# Patient Record
Sex: Male | Born: 1985 | Race: White | Hispanic: No | Marital: Single | Smoking: Current every day smoker
Health system: Southern US, Community
[De-identification: ages and names within clinical notes are randomized; demographics above are authoritative.]

## PROBLEM LIST (undated history)

## (undated) DIAGNOSIS — I10 Essential (primary) hypertension: Secondary | ICD-10-CM

---

## 2005-10-25 ENCOUNTER — Emergency Department: Payer: Self-pay | Admitting: Internal Medicine

## 2005-11-01 ENCOUNTER — Emergency Department: Payer: Self-pay | Admitting: Emergency Medicine

## 2006-06-24 ENCOUNTER — Emergency Department: Payer: Self-pay | Admitting: Unknown Physician Specialty

## 2017-03-20 ENCOUNTER — Ambulatory Visit: Payer: Self-pay

## 2017-03-20 NOTE — Telephone Encounter (Signed)
Pt. called to office to schedule an appt. To get re-established with provider at [facility].  He stated he was waking up at night with acid reflux and chest discomfort.  When call was transferred, the call go disconnected; unsure if pt. hung up.  Attempted to call pt. back, but unable to reach pt. Or leave message; "voice mail full".

## 2017-03-22 NOTE — Telephone Encounter (Signed)
Several attempts made to reach pt. By phone; left message to call office to speak to Triage nurse re: c/o acid reflux.

## 2017-04-19 ENCOUNTER — Ambulatory Visit: Payer: Self-pay | Admitting: Family Medicine

## 2017-06-28 ENCOUNTER — Encounter

## 2017-06-28 ENCOUNTER — Ambulatory Visit: Payer: Self-pay | Admitting: Family Medicine

## 2019-08-21 ENCOUNTER — Emergency Department: Payer: Self-pay

## 2019-08-21 ENCOUNTER — Emergency Department
Admission: EM | Admit: 2019-08-21 | Discharge: 2019-08-21 | Disposition: A | Discharge status: Home / Self Care | Payer: Self-pay | Attending: Emergency Medicine | Admitting: Emergency Medicine

## 2019-08-21 ENCOUNTER — Other Ambulatory Visit: Payer: Self-pay

## 2019-08-21 DIAGNOSIS — Y9389 Activity, other specified: Secondary | ICD-10-CM | POA: Insufficient documentation

## 2019-08-21 DIAGNOSIS — W11XXXA Fall on and from ladder, initial encounter: Secondary | ICD-10-CM | POA: Insufficient documentation

## 2019-08-21 DIAGNOSIS — Y999 Unspecified external cause status: Secondary | ICD-10-CM | POA: Insufficient documentation

## 2019-08-21 DIAGNOSIS — Y9289 Other specified places as the place of occurrence of the external cause: Secondary | ICD-10-CM | POA: Insufficient documentation

## 2019-08-21 DIAGNOSIS — S92062A Displaced intraarticular fracture of left calcaneus, initial encounter for closed fracture: Secondary | ICD-10-CM | POA: Insufficient documentation

## 2019-08-21 DIAGNOSIS — S82892A Other fracture of left lower leg, initial encounter for closed fracture: Secondary | ICD-10-CM

## 2019-08-21 MED ORDER — IBUPROFEN 600 MG PO TABS: 600.0000 mg | ORAL_TABLET | Freq: Three times a day (TID) | ORAL | 0 refills | Status: AC | PRN

## 2019-08-21 MED ORDER — OXYCODONE-ACETAMINOPHEN 7.5-325 MG PO TABS: 1.0000 | ORAL_TABLET | Freq: Four times a day (QID) | ORAL | 0 refills | Status: DC | PRN

## 2019-08-21 MED ORDER — OXYCODONE-ACETAMINOPHEN 5-325 MG PO TABS
1.0000 | ORAL_TABLET | ORAL | Status: DC | PRN
  Administered 2019-08-21: 1 via ORAL
  Filled 2019-08-21: qty 1

## 2019-08-21 NOTE — Discharge Instructions (Signed)
Wear splint and ambulate with crutches pending further evaluation by orthopedics.  Call Monday morning and tell them you are a follow-up from the emergency room.  Be advised medication will cause drowsiness.

## 2019-08-21 NOTE — ED Triage Notes (Signed)
Pt arrives via POV after falling off a ladder around 11:30 this morning. Pt reports he stepped out of a window onto the ladder and the ladder slipped out from under him and he fell to the ground and felt a pop when his left foot hit the ground. Pt denies hitting head , no LOC. Pt in NAD, skin warm and dry

## 2019-08-21 NOTE — ED Provider Notes (Signed)
[facility] Emergency Department Provider Note   ____________________________________________   First MD Initiated Contact with Patient 08/21/19 1359     (approximate)  I have reviewed the triage vital signs and the nursing notes.   HISTORY  Chief Complaint Ankle Injury    HPI Jason Hines is a 34 y.o. male patient complain of left foot pain secondary to a fall.  Patient state he was stepping out of a window onto a ladder and the ladder slipped from under him and he fell landing on the ground.  Patient state he felt a "pop" in his left foot when at the ground.  Patient denies LOC or head injury.  Patient denies neck or back pain.  Patient unable to bear weight secondary to pain.  Patient rates pain as a 10/10.  Patient described the pain as "achy".  No palliative measure prior to arrival.         History reviewed. No pertinent past medical history.  There are no problems to display for this patient.     Prior to Admission medications   Medication Sig Start Date End Date Taking? Authorizing Provider  ibuprofen (ADVIL) 600 MG tablet Take 1 tablet (600 mg total) by mouth every 8 (eight) hours as needed. 08/21/19   Joni Reining, PA-C  oxyCODONE-acetaminophen (PERCOCET) 7.5-325 MG tablet Take 1 tablet by mouth every 6 (six) hours as needed. 08/21/19   Joni Reining, PA-C    Allergies Patient has no known allergies.  History reviewed. No pertinent family history.  Social History Social History   Tobacco Use   Smoking status: Not on file  Substance Use Topics   Alcohol use: Not on file   Drug use: Not on file    Review of Systems Constitutional: No fever/chills Eyes: No visual changes. ENT: No sore throat. Cardiovascular: Denies chest pain. Respiratory: Denies shortness of breath. Gastrointestinal: No abdominal pain.  No nausea, no vomiting.  No diarrhea.  No constipation. Genitourinary: Negative for dysuria. Musculoskeletal: Left  foot pain. Skin: Negative for rash. Neurological: Negative for headaches, focal weakness or numbness.   ____________________________________________   PHYSICAL EXAM:  VITAL SIGNS: ED Triage Vitals [08/21/19 1231]  Enc Vitals Group     BP (!) 145/105     Pulse Rate 65     Resp 18     Temp 98.3 F (36.8 C)     Temp Source Oral     SpO2 98 %     Weight (!) 210 lb (95.3 kg)     Height 5\' 9"  (1.753 m)     Head Circumference      Peak Flow      Pain Score 10     Pain Loc      Pain Edu?      Excl. in GC?    Constitutional: Alert and oriented. Well appearing and in no acute distress. Cardiovascular: Normal rate, regular rhythm. Grossly normal heart sounds.  Good peripheral circulation. Respiratory: Normal respiratory effort.  No retractions. Lungs CTAB. Gastrointestinal: Soft and nontender. No Musculoskeletal: No obvious deformity to the left foot.  Patient has moderate guarding palpation of the talar joint.  Patient decreased range of motion with flexion extension of foot limited by complaint of pain. Neurologic:  Normal speech and language. No gross focal neurologic deficits are appreciated. No gait instability. Skin:  Skin is warm, dry and intact. No rash noted. Psychiatric: Mood and affect are normal. Speech and behavior are normal.  ____________________________________________  LABS (all labs ordered are listed, but only abnormal results are displayed)  Labs Reviewed - No data to display ____________________________________________  EKG   ____________________________________________  RADIOLOGY  ED MD interpretation:    Official radiology report(s): DG Ankle Complete Left  Result Date: 08/21/2019 CLINICAL DATA:  Left ankle pain after fall EXAM: LEFT ANKLE COMPLETE - 3+ VIEW COMPARISON:  None. FINDINGS: Comminuted fractures of the mid calcaneal body with intra-articular extension to the posterior subtalar joint. Subtalar joint space appears widened. There is a  fracture fragment at the lateral aspect of the calcaneus measuring at least 1.7 cm with mild lateral displacement. Suspect small nondisplaced fracture from the lateral aspect of the cuboid. Ankle mortise is congruent without dislocation. There is soft tissue swelling at the lateral hindfoot. IMPRESSION: 1. Comminuted fractures of the mid calcaneal body with intra-articular extension to the posterior subtalar joint. Subtalar joint appears slightly widened. Further evaluation could be performed with CT as clinically indicated. 2. Suspect small nondisplaced fracture from the lateral aspect of the cuboid. Electronically Signed   By: Duanne Guess D.O.   On: 08/21/2019 13:32    ____________________________________________   PROCEDURES  Procedure(s) performed (including Critical Care):  Procedures   ____________________________________________   INITIAL IMPRESSION / ASSESSMENT AND PLAN / ED COURSE  As part of my medical decision making, I reviewed the following data within the electronic MEDICAL RECORD NUMBER     Patient presents with left foot pain secondary to fall.  Discussed x-ray findings with patient.  Patient placed in a splint and given crutches to assist with ambulation.  Patient will follow up with podiatry by calling for an appointment.  Patient advised to drug effects of medication.  Patient given a work note.    [name] was evaluated in Emergency Department on 08/21/2019 for the symptoms described in the history of present illness. He was evaluated in the context of the global COVID-19 pandemic, which necessitated consideration that the patient might be at risk for infection with the SARS-CoV-2 virus that causes COVID-19. Institutional protocols and algorithms that pertain to the evaluation of patients at risk for COVID-19 are in a state of rapid change based on information released by regulatory bodies including the CDC and federal and state organizations. These policies and  algorithms were followed during the patient's care in the ED.       ____________________________________________   FINAL CLINICAL IMPRESSION(S) / ED DIAGNOSES  Final diagnoses:  Closed fracture of left ankle, initial encounter     ED Discharge Orders         Ordered    oxyCODONE-acetaminophen (PERCOCET) 7.5-325 MG tablet  Every 6 hours PRN     Discontinue  Reprint     08/21/19 1421    ibuprofen (ADVIL) 600 MG tablet  Every 8 hours PRN     Discontinue  Reprint     08/21/19 1421           Note:  This document was prepared using Dragon voice recognition software and may include unintentional dictation errors.    Joni Reining, PA-C 08/21/19 1427    Sharyn Creamer, MD 08/21/19 [phone]

## 2019-08-21 NOTE — ED Notes (Signed)
See triage note  Presents with injury to left ankle  States he fell from ladder,approx 3 ft  Rolled his ankle  unable to bear full wt  Good pulses

## 2019-08-24 ENCOUNTER — Other Ambulatory Visit: Payer: Self-pay | Admitting: Podiatry

## 2019-08-24 DIAGNOSIS — S92012D Displaced fracture of body of left calcaneus, subsequent encounter for fracture with routine healing: Secondary | ICD-10-CM

## 2019-08-25 ENCOUNTER — Ambulatory Visit
Admission: RE | Admit: 2019-08-25 | Discharge: 2019-08-25 | Disposition: A | Discharge status: Home / Self Care | Payer: Self-pay | Source: Ambulatory Visit | Attending: Podiatry | Admitting: Podiatry

## 2019-08-25 ENCOUNTER — Other Ambulatory Visit: Payer: Self-pay

## 2019-08-25 DIAGNOSIS — S92012D Displaced fracture of body of left calcaneus, subsequent encounter for fracture with routine healing: Secondary | ICD-10-CM | POA: Insufficient documentation

## 2019-08-31 ENCOUNTER — Other Ambulatory Visit: Payer: Self-pay | Admitting: Podiatry

## 2019-08-31 ENCOUNTER — Encounter
Admission: RE | Admit: 2019-08-31 | Discharge: 2019-08-31 | Disposition: A | Discharge status: Home / Self Care | Payer: Self-pay | Source: Ambulatory Visit | Attending: Podiatry | Admitting: Podiatry

## 2019-08-31 ENCOUNTER — Other Ambulatory Visit: Payer: Self-pay

## 2019-08-31 NOTE — Patient Instructions (Signed)
Your procedure is scheduled on: 09/04/19  Report to DAY SURGERY DEPARTMENT LOCATED ON 2ND FLOOR MEDICAL MALL ENTRANCE. To find out your arrival time please call [phone] between 1PM - 3PM on 09/03/19.  Remember: Instructions that are not followed completely may result in serious medical risk, up to and including death, or upon the discretion of your surgeon and anesthesiologist your surgery may need to be rescheduled.     _X__ 1. Do not eat food after midnight the night before your procedure.                 No gum chewing or hard candies. You may drink clear liquids up to 2 hours                 before you are scheduled to arrive for your surgery- DO not drink clear                 liquids within 2 hours of the start of your surgery.                 Clear Liquids include:  water, apple juice without pulp, clear carbohydrate                 drink such as Clearfast or Gatorade, Black Coffee or Tea (Do not add                 anything to coffee or tea). Diabetics water only  __X__2.  On the morning of surgery brush your teeth with toothpaste and water, you                 may rinse your mouth with mouthwash if you wish.  Do not swallow any              toothpaste of mouthwash.     _X__ 3.  No Alcohol for 24 hours before or after surgery.   _X__ 4.  Do Not Smoke or use e-cigarettes For 24 Hours Prior to Your Surgery.                 Do not use any chewable tobacco products for at least 6 hours prior to                 surgery.  ____  5.  Bring all medications with you on the day of surgery if instructed.   __X__  6.  Notify your doctor if there is any change in your medical condition      (cold, fever, infections).     Do not wear jewelry, make-up, hairpins, clips or nail polish. Do not wear lotions, powders, or perfumes.  Do not shave 48 hours prior to surgery. Men may shave face and neck. Do not bring valuables to the hospital.    [facility] is not responsible for any belongings  or valuables.  Contacts, dentures/partials or body piercings may not be worn into surgery. Bring a case for your contacts, glasses or hearing aids, a denture cup will be supplied. Leave your suitcase in the car. After surgery it may be brought to your room. For patients admitted to the hospital, discharge time is determined by your treatment team.   Patients discharged the day of surgery will not be allowed to drive home.   Please read over the following fact sheets that you were given:   MRSA Information  __X__ Take these medicines the morning of surgery with A SIP OF WATER:  1. oxyCODONE-acetaminophen (PERCOCET) 7.5-325 MG tablet if needed  2.   3.   4.  5.  6.  ____ Fleet Enema (as directed)   __X__ Use CHG Soap/SAGE wipes as directed  ____ Use inhalers on the day of surgery  ____ Stop metformin/Janumet/Farxiga 2 days prior to surgery    ____ Take 1/2 of usual insulin dose the night before surgery. No insulin the morning          of surgery.   ____ Stop Blood Thinners Coumadin/Plavix/Xarelto/Pleta/Pradaxa/Eliquis/Effient/Aspirin  on   Or contact your Surgeon, Cardiologist or Medical Doctor regarding  ability to stop your blood thinners  __X__ Stop Anti-inflammatories 7 days before surgery such as Advil, Ibuprofen, Motrin,  BC or Goodies Powder, Naprosyn, Naproxen, Aleve, Aspirin    __X__ Stop all herbal supplements, fish oil or vitamin E until after surgery.    ____ Bring C-Pap to the hospital.    ENSURE PRE SURGERY DRINK TO BE FINISHED 2 HOURS PRIOR TO ARRIVAL    How to Use an Incentive Spirometer An incentive spirometer is a tool that measures how well you are filling your lungs with each breath. Learning to take long, deep breaths using this tool can help you keep your lungs clear and active. This may help to reverse or lessen your chance of developing breathing (pulmonary) problems, especially infection. You may be asked to use a spirometer:  After a  surgery.  If you have a lung problem or a history of smoking.  After a long period of time when you have been unable to move or be active. If the spirometer includes an indicator to show the highest number that you have reached, your health care provider or respiratory therapist will help you set a goal. Keep a list (log) of your progress as told by your health care provider. What are the risks?  Breathing too quickly may cause dizziness or cause you to pass out. Take your time so you do not get dizzy or light-headed.  If you are in pain, you may need to take pain medicine before doing incentive spirometry. It is harder to take a deep breath if you are having pain. How to use your incentive spirometer  1. Sit up on the edge of your bed or on a chair. 2. Hold the incentive spirometer so that it is in an upright position. 3. Before you use the spirometer, breathe out normally. 4. Place the mouthpiece in your mouth. Make sure your lips are closed tightly around it. 5. Breathe in slowly and as deeply as you can through your mouth, causing the piston or the ball to rise toward the top of the chamber. 6. Hold your breath for 3-5 seconds, or for as long as possible. ? If the spirometer includes a coach indicator, use this to guide you in breathing. Slow down your breathing if the indicator goes above the marked areas. 7. Remove the mouthpiece from your mouth and breathe out normally. The piston or ball will return to the bottom of the chamber. 8. Rest for a few seconds, then repeat the steps 10 or more times. ? Take your time and take a few normal breaths between deep breaths so that you do not get dizzy or light-headed. ? Do this every 1-2 hours when you are awake. 9. If the spirometer includes a goal marker to show the highest number you have reached (best effort), use this as a goal to work toward during each repetition. 10. After each  set of 10 deep breaths, cough a few times. This will help to  make sure that your lungs are clear. ? If you have an incision on your chest or abdomen from surgery, place a pillow or a rolled-up towel firmly against the incision when you cough. This can help to reduce pain from coughing. General tips  When you become able to get out of bed, walk around often and continue to cough to help clear your lungs.  Keep using the incentive spirometer until your health care provider says it is okay to stop using it. If you have been in the hospital, you may be told to keep using the spirometer at home. Contact a health care provider if:  You are having difficulty using the spirometer.  You have trouble using the spirometer as often as instructed.  Your pain medicine is not giving enough relief for you to use the spirometer as told.  You have a fever.  You develop shortness of breath. Get help right away if:  You develop a cough with bloody mucus from the lungs (bloody sputum).  You have fluid or blood coming from an incision site after you cough. Summary  An incentive spirometer is a tool that can help you learn to take long, deep breaths to keep your lungs clear and active.  You may be asked to use a spirometer after a surgery, if you have a lung problem or a history of smoking, or if you have been inactive for a long period of time.  Use your incentive spirometer as instructed every 1-2 hours while you are awake.  If you have an incision on your chest or abdomen, place a pillow or a rolled-up towel firmly against your incision when you cough. This will help to reduce pain. This information is not intended to replace advice given to you by your health care provider. Make sure you discuss any questions you have with your health care provider. Document Revised: 08/08/2018 Document Reviewed: 11/21/2016 Elsevier Patient Education  2020 [employer].

## 2019-09-02 ENCOUNTER — Other Ambulatory Visit
Admission: RE | Admit: 2019-09-02 | Discharge: 2019-09-02 | Disposition: A | Discharge status: Home / Self Care | Payer: Self-pay | Source: Ambulatory Visit | Attending: Family Medicine | Admitting: Family Medicine

## 2019-09-02 ENCOUNTER — Other Ambulatory Visit: Payer: Self-pay

## 2019-09-02 DIAGNOSIS — Z01812 Encounter for preprocedural laboratory examination: Secondary | ICD-10-CM | POA: Insufficient documentation

## 2019-09-02 DIAGNOSIS — Z20822 Contact with and (suspected) exposure to covid-19: Secondary | ICD-10-CM | POA: Insufficient documentation

## 2019-09-02 LAB — SARS CORONAVIRUS 2 (TAT 6-24 HRS): SARS Coronavirus 2: NEGATIVE

## 2019-09-04 ENCOUNTER — Other Ambulatory Visit: Payer: Self-pay

## 2019-09-04 ENCOUNTER — Ambulatory Visit: Payer: Self-pay

## 2019-09-04 ENCOUNTER — Ambulatory Visit
Admission: RE | Admit: 2019-09-04 | Discharge: 2019-09-04 | Disposition: A | Discharge status: Home / Self Care | Payer: Self-pay | Attending: Podiatry | Admitting: Podiatry

## 2019-09-04 ENCOUNTER — Ambulatory Visit: Payer: Self-pay | Admitting: Anesthesiology

## 2019-09-04 ENCOUNTER — Encounter: Payer: Self-pay | Admitting: Podiatry

## 2019-09-04 ENCOUNTER — Encounter
Admission: RE | Disposition: A | Discharge status: Home / Self Care | Payer: Self-pay | Source: Home / Self Care | Attending: Podiatry

## 2019-09-04 DIAGNOSIS — M25572 Pain in left ankle and joints of left foot: Secondary | ICD-10-CM

## 2019-09-04 DIAGNOSIS — T148XXA Other injury of unspecified body region, initial encounter: Secondary | ICD-10-CM

## 2019-09-04 DIAGNOSIS — W1789XA Other fall from one level to another, initial encounter: Secondary | ICD-10-CM | POA: Insufficient documentation

## 2019-09-04 DIAGNOSIS — S92062A Displaced intraarticular fracture of left calcaneus, initial encounter for closed fracture: Secondary | ICD-10-CM | POA: Insufficient documentation

## 2019-09-04 DIAGNOSIS — R262 Difficulty in walking, not elsewhere classified: Secondary | ICD-10-CM | POA: Insufficient documentation

## 2019-09-04 DIAGNOSIS — F17210 Nicotine dependence, cigarettes, uncomplicated: Secondary | ICD-10-CM | POA: Insufficient documentation

## 2019-09-04 HISTORY — PX: ORIF CALCANEOUS FRACTURE: SHX5030

## 2019-09-04 SURGERY — OPEN REDUCTION INTERNAL FIXATION (ORIF) CALCANEOUS FRACTURE
Anesthesia: General | Site: Foot | Laterality: Left

## 2019-09-04 MED ORDER — HYDRALAZINE HCL 20 MG/ML IJ SOLN
INTRAMUSCULAR | Status: AC
  Filled 2019-09-04: qty 1

## 2019-09-04 MED ORDER — SUGAMMADEX SODIUM 200 MG/2ML IV SOLN
INTRAVENOUS | Status: DC | PRN
  Administered 2019-09-04: 300 mg via INTRAVENOUS

## 2019-09-04 MED ORDER — FENTANYL CITRATE (PF) 100 MCG/2ML IJ SOLN
25.0000 ug | INTRAMUSCULAR | Status: AC | PRN
  Administered 2019-09-04: 50 ug via INTRAVENOUS
  Administered 2019-09-04 (×3): 25 ug via INTRAVENOUS

## 2019-09-04 MED ORDER — MIDAZOLAM HCL 2 MG/2ML IJ SOLN
INTRAMUSCULAR | Status: AC
  Administered 2019-09-04: 1 mg via INTRAVENOUS
  Filled 2019-09-04: qty 2

## 2019-09-04 MED ORDER — FAMOTIDINE 20 MG PO TABS
ORAL_TABLET | ORAL | Status: AC
  Administered 2019-09-04: 20 mg via ORAL
  Filled 2019-09-04: qty 1

## 2019-09-04 MED ORDER — BUPIVACAINE HCL (PF) 0.5 % IJ SOLN
INTRAMUSCULAR | Status: AC
  Filled 2019-09-04: qty 30

## 2019-09-04 MED ORDER — LIDOCAINE HCL (CARDIAC) PF 100 MG/5ML IV SOSY
PREFILLED_SYRINGE | INTRAVENOUS | Status: DC | PRN
  Administered 2019-09-04: 100 mg via INTRAVENOUS

## 2019-09-04 MED ORDER — FENTANYL CITRATE (PF) 100 MCG/2ML IJ SOLN
INTRAMUSCULAR | Status: AC
  Administered 2019-09-04: 50 ug via INTRAVENOUS
  Filled 2019-09-04: qty 2

## 2019-09-04 MED ORDER — OXYCODONE HCL 5 MG PO TABS
ORAL_TABLET | ORAL | Status: AC
  Filled 2019-09-04: qty 1

## 2019-09-04 MED ORDER — FENTANYL CITRATE (PF) 100 MCG/2ML IJ SOLN: 50.0000 ug | Freq: Once | INTRAMUSCULAR | Status: AC

## 2019-09-04 MED ORDER — CEFAZOLIN SODIUM-DEXTROSE 2-4 GM/100ML-% IV SOLN
2.0000 g | INTRAVENOUS | Status: AC
  Administered 2019-09-04: 2 g via INTRAVENOUS

## 2019-09-04 MED ORDER — CEFAZOLIN SODIUM-DEXTROSE 2-4 GM/100ML-% IV SOLN
INTRAVENOUS | Status: AC
  Filled 2019-09-04: qty 100

## 2019-09-04 MED ORDER — CHLORHEXIDINE GLUCONATE 0.12 % MT SOLN
OROMUCOSAL | Status: AC
  Administered 2019-09-04: 15 mL via OROMUCOSAL
  Filled 2019-09-04: qty 15

## 2019-09-04 MED ORDER — POVIDONE-IODINE 7.5 % EX SOLN
Freq: Once | CUTANEOUS | Status: DC
  Filled 2019-09-04: qty 118

## 2019-09-04 MED ORDER — SEVOFLURANE IN SOLN
RESPIRATORY_TRACT | Status: AC
  Filled 2019-09-04: qty 250

## 2019-09-04 MED ORDER — PROPOFOL 10 MG/ML IV BOLUS
INTRAVENOUS | Status: DC | PRN
  Administered 2019-09-04: 200 mg via INTRAVENOUS

## 2019-09-04 MED ORDER — MIDAZOLAM HCL 2 MG/2ML IJ SOLN: 1.0000 mg | Freq: Once | INTRAMUSCULAR | Status: AC

## 2019-09-04 MED ORDER — OXYCODONE-ACETAMINOPHEN 7.5-325 MG PO TABS: 1.0000 | ORAL_TABLET | ORAL | 0 refills | Status: AC | PRN

## 2019-09-04 MED ORDER — ONDANSETRON HCL 4 MG/2ML IJ SOLN
INTRAMUSCULAR | Status: DC | PRN
  Administered 2019-09-04: 4 mg via INTRAVENOUS

## 2019-09-04 MED ORDER — PROPOFOL 10 MG/ML IV BOLUS
INTRAVENOUS | Status: AC
  Filled 2019-09-04: qty 20

## 2019-09-04 MED ORDER — APIXABAN 2.5 MG PO TABS: 2.5000 mg | ORAL_TABLET | Freq: Two times a day (BID) | ORAL | 1 refills | Status: AC

## 2019-09-04 MED ORDER — FENTANYL CITRATE (PF) 100 MCG/2ML IJ SOLN
INTRAMUSCULAR | Status: AC
  Filled 2019-09-04: qty 2

## 2019-09-04 MED ORDER — LACTATED RINGERS IV SOLN: INTRAVENOUS | Status: DC

## 2019-09-04 MED ORDER — ONDANSETRON HCL 4 MG PO TABS: 4.0000 mg | ORAL_TABLET | Freq: Three times a day (TID) | ORAL | 0 refills | Status: AC | PRN

## 2019-09-04 MED ORDER — ASPIRIN EC 81 MG PO TBEC: 81.0000 mg | DELAYED_RELEASE_TABLET | Freq: Every day | ORAL | 11 refills | Status: AC

## 2019-09-04 MED ORDER — ROCURONIUM BROMIDE 10 MG/ML (PF) SYRINGE
PREFILLED_SYRINGE | INTRAVENOUS | Status: AC
  Filled 2019-09-04: qty 10

## 2019-09-04 MED ORDER — CEPHALEXIN 500 MG PO CAPS: 500.0000 mg | ORAL_CAPSULE | Freq: Three times a day (TID) | ORAL | 0 refills | Status: AC

## 2019-09-04 MED ORDER — CHLORHEXIDINE GLUCONATE 0.12 % MT SOLN: 15.0000 mL | Freq: Once | OROMUCOSAL | Status: AC

## 2019-09-04 MED ORDER — LIDOCAINE HCL (PF) 1 % IJ SOLN
INTRAMUSCULAR | Status: DC | PRN
  Administered 2019-09-04 (×2): 1 mL via SUBCUTANEOUS

## 2019-09-04 MED ORDER — OXYCODONE HCL 5 MG/5ML PO SOLN: 5.0000 mg | Freq: Once | ORAL | Status: AC | PRN

## 2019-09-04 MED ORDER — FAMOTIDINE 20 MG PO TABS: 20.0000 mg | ORAL_TABLET | Freq: Once | ORAL | Status: AC

## 2019-09-04 MED ORDER — FENTANYL CITRATE (PF) 100 MCG/2ML IJ SOLN
INTRAMUSCULAR | Status: AC
  Administered 2019-09-04: 25 ug via INTRAVENOUS
  Filled 2019-09-04: qty 2

## 2019-09-04 MED ORDER — MIDAZOLAM HCL 2 MG/2ML IJ SOLN
INTRAMUSCULAR | Status: AC
  Filled 2019-09-04: qty 2

## 2019-09-04 MED ORDER — OXYCODONE HCL 5 MG PO TABS
5.0000 mg | ORAL_TABLET | Freq: Once | ORAL | Status: AC | PRN
  Administered 2019-09-04: 5 mg via ORAL

## 2019-09-04 MED ORDER — FENTANYL CITRATE (PF) 250 MCG/5ML IJ SOLN
INTRAMUSCULAR | Status: AC
  Filled 2019-09-04: qty 5

## 2019-09-04 MED ORDER — HYDRALAZINE HCL 20 MG/ML IJ SOLN
10.0000 mg | Freq: Once | INTRAMUSCULAR | Status: AC
  Administered 2019-09-04: 10 mg via INTRAVENOUS
  Filled 2019-09-04: qty 0.5

## 2019-09-04 MED ORDER — LIDOCAINE HCL (PF) 1 % IJ SOLN
INTRAMUSCULAR | Status: AC
  Filled 2019-09-04: qty 5

## 2019-09-04 MED ORDER — ORAL CARE MOUTH RINSE: 15.0000 mL | Freq: Once | OROMUCOSAL | Status: AC

## 2019-09-04 MED ORDER — BUPIVACAINE HCL (PF) 0.5 % IJ SOLN
INTRAMUSCULAR | Status: DC | PRN
  Administered 2019-09-04: 5 mL via PERINEURAL
  Administered 2019-09-04: 10 mL via PERINEURAL
  Administered 2019-09-04: 5 mL via PERINEURAL
  Administered 2019-09-04: 10 mL via PERINEURAL

## 2019-09-04 MED ORDER — ROCURONIUM BROMIDE 100 MG/10ML IV SOLN
INTRAVENOUS | Status: DC | PRN
  Administered 2019-09-04: 100 mg via INTRAVENOUS
  Administered 2019-09-04: 20 mg via INTRAVENOUS

## 2019-09-04 MED ORDER — LIDOCAINE HCL (PF) 2 % IJ SOLN
INTRAMUSCULAR | Status: AC
  Filled 2019-09-04: qty 5

## 2019-09-04 MED ORDER — FENTANYL CITRATE (PF) 100 MCG/2ML IJ SOLN
INTRAMUSCULAR | Status: DC | PRN
  Administered 2019-09-04: 50 ug via INTRAVENOUS
  Administered 2019-09-04 (×2): 100 ug via INTRAVENOUS
  Administered 2019-09-04 (×2): 50 ug via INTRAVENOUS

## 2019-09-04 SURGICAL SUPPLY — 64 items
BIT DRILL 2.4 AO COUPLING CANN (BIT) ×3 IMPLANT
BIT DRILL 2.5X2.75 QC CALB (BIT) ×3 IMPLANT
BIT DRILL CALIBRATED 2.7 (BIT) ×2 IMPLANT
BIT DRILL CALIBRATED 2.7MM (BIT) ×1
BLADE SURG 15 STRL LF DISP TIS (BLADE) ×1 IMPLANT
BLADE SURG 15 STRL SS (BLADE) ×3
BNDG CMPR STD VLCR NS LF 5.8X4 (GAUZE/BANDAGES/DRESSINGS) ×1
BNDG COHESIVE 4X5 TAN STRL (GAUZE/BANDAGES/DRESSINGS) ×3 IMPLANT
BNDG CONFORM 2 STRL LF (GAUZE/BANDAGES/DRESSINGS) ×3 IMPLANT
BNDG CONFORM 3 STRL LF (GAUZE/BANDAGES/DRESSINGS) ×3 IMPLANT
BNDG ELASTIC 4X5.8 VLCR NS LF (GAUZE/BANDAGES/DRESSINGS) ×3 IMPLANT
BNDG ESMARK 4X12 TAN STRL LF (GAUZE/BANDAGES/DRESSINGS) ×3 IMPLANT
BNDG GAUZE 4.5X4.1 6PLY STRL (MISCELLANEOUS) ×3 IMPLANT
CANISTER SUCT 1200ML W/VALVE (MISCELLANEOUS) ×3 IMPLANT
CEMENT CAL PHOSPHATE 5CC (Cement) ×3 IMPLANT
CLOSURE WOUND 1/2 X4 (GAUZE/BANDAGES/DRESSINGS) ×1
COVER WAND RF STERILE (DRAPES) ×3 IMPLANT
CUFF TOURN SGL QUICK 30 (TOURNIQUET CUFF) ×3
CUFF TRNQT CYL 30X4X21-28X (TOURNIQUET CUFF) ×1 IMPLANT
DRAPE C-ARM XRAY 36X54 (DRAPES) ×3 IMPLANT
DRAPE C-ARMOR (DRAPES) ×3 IMPLANT
DURAPREP 26ML APPLICATOR (WOUND CARE) ×6 IMPLANT
ELECT REM PT RETURN 9FT ADLT (ELECTROSURGICAL) ×3
ELECTRODE REM PT RTRN 9FT ADLT (ELECTROSURGICAL) ×1 IMPLANT
GAUZE SPONGE 4X4 12PLY STRL (GAUZE/BANDAGES/DRESSINGS) ×3 IMPLANT
GAUZE XEROFORM 1X8 LF (GAUZE/BANDAGES/DRESSINGS) ×3 IMPLANT
GLOVE BIO SURGEON STRL SZ7 (GLOVE) ×3 IMPLANT
GLOVE INDICATOR 7.0 STRL GRN (GLOVE) ×3 IMPLANT
GOWN STRL REUS W/ TWL LRG LVL3 (GOWN DISPOSABLE) ×3 IMPLANT
GOWN STRL REUS W/TWL LRG LVL3 (GOWN DISPOSABLE) ×9
K-WIRE ACE 1.6X6 (WIRE) ×9
K-WIRE TROC 1.25X150 (WIRE) ×3
KIT TURNOVER KIT A (KITS) ×3 IMPLANT
KWIRE ACE 1.6X6 (WIRE) ×3 IMPLANT
KWIRE TROC 1.25X150 (WIRE) ×1 IMPLANT
LABEL OR SOLS (LABEL) ×3 IMPLANT
NEEDLE HYPO 22GX1.5 SAFETY (NEEDLE) ×3 IMPLANT
NS IRRIG 500ML POUR BTL (IV SOLUTION) ×6 IMPLANT
PACK EXTREMITY (MISCELLANEOUS) ×3 IMPLANT
PAD PREP 24X41 OB/GYN DISP (PERSONAL CARE ITEMS) ×3 IMPLANT
PLATE LOCK CALC LG EXTD 2H LT (Plate) ×3 IMPLANT
SCREW CANNULATED PT 4.0X40 (Screw) ×3 IMPLANT
SCREW CORT 3.5X32 (Screw) ×3 IMPLANT
SCREW CORT T15 32X3.5XST LCK (Screw) ×1 IMPLANT
SCREW LOCK CORT STAR 3.5X26 (Screw) ×3 IMPLANT
SCREW LOCK CORT STAR 3.5X28 (Screw) ×6 IMPLANT
SCREW LOW PROFILE 3.5MMX42 (Screw) ×3 IMPLANT
SCREW NL LP 36X3.5 (Screw) ×3 IMPLANT
SPLINT CAST 1 STEP 4X30 (MISCELLANEOUS) ×3 IMPLANT
SPLINT FAST PLASTER 5X30 (CAST SUPPLIES) ×2
SPLINT PLASTER CAST FAST 5X30 (CAST SUPPLIES) ×1 IMPLANT
SPONGE LAP 18X18 RF (DISPOSABLE) ×3 IMPLANT
STAPLER SKIN PROX 35W (STAPLE) ×3 IMPLANT
STOCKINETTE M/LG 89821 (MISCELLANEOUS) ×3 IMPLANT
STRAP SAFETY 5IN WIDE (MISCELLANEOUS) ×3 IMPLANT
STRIP CLOSURE SKIN 1/2X4 (GAUZE/BANDAGES/DRESSINGS) ×2 IMPLANT
SUT ETHILON 3-0 FS-10 30 BLK (SUTURE) ×6
SUT VIC AB 2-0 CT1 27 (SUTURE) ×3
SUT VIC AB 2-0 CT1 TAPERPNT 27 (SUTURE) ×1 IMPLANT
SUT VIC AB 3-0 SH 27 (SUTURE) ×6
SUT VIC AB 3-0 SH 27X BRD (SUTURE) ×2 IMPLANT
SUTURE EHLN 3-0 FS-10 30 BLK (SUTURE) ×2 IMPLANT
SWABSTK COMLB BENZOIN TINCTURE (MISCELLANEOUS) ×3 IMPLANT
SYR 10ML LL (SYRINGE) ×3 IMPLANT

## 2019-09-04 NOTE — Anesthesia Postprocedure Evaluation (Signed)
Anesthesia Post Note  Patient: Jason Hines  Procedure(s) Performed: OPEN REDUCTION INTERNAL FIXATION (ORIF) CALCANEOUS FRACTURE LEFT (Left Foot)  Patient location during evaluation: PACU Anesthesia Type: General Level of consciousness: awake and alert Pain management: pain level controlled Vital Signs Assessment: post-procedure vital signs reviewed and stable Respiratory status: spontaneous breathing, nonlabored ventilation, respiratory function stable and patient connected to nasal cannula oxygen Cardiovascular status: blood pressure returned to baseline and stable Postop Assessment: no apparent nausea or vomiting Anesthetic complications: no   No complications documented.   Last Vitals:  Vitals:   09/04/19 1449 09/04/19 1458  BP: (!) 155/104 (!) 159/109  Pulse: 81 78  Resp: 13 20  Temp:    SpO2: 97% 95%    Last Pain:  Vitals:   09/04/19 1444  TempSrc:   PainSc: 4                  Cleda Mccreedy Valory Wetherby

## 2019-09-04 NOTE — Op Note (Signed)
PODIATRY / FOOT AND ANKLE SURGERY OPERATIVE REPORT    SURGEON: Rosetta Posner, DPM  ASSISTANT: Gwyneth Revels, DPM  PRE-OPERATIVE DIAGNOSIS:  1. Displaced comminuted calcaneal intra-articular close fractured 2. Hx of tobacco abuse  POST-OPERATIVE DIAGNOSIS: same  PROCEDURE(S): 1. L calcaneal fracture ORIF  HEMOSTASIS: L thigh tourniquet  ANESTHESIA: general  ESTIMATED BLOOD LOSS: 20 cc  FINDING(S): 1.  Comminuted intraarticular calcaneal fracture with displacement of posterior facet  PATHOLOGY/SPECIMEN(S):  none  INDICATIONS:   Jason Hines is a 34 y.o. male who presents with a fracture to the left calcaneus.  Patient had this fracture on 08/22/2019 after falling from a height.  He went to the emergency room after his injury where x-rays were taken which showed a calcaneal fracture involving the posterior facet in which there was displacement.  A CT scan was not taken at that time but the patient was sent for clinic to myself for evaluation.  Patient was noted to have notable swelling at that time and did not have a CT scan performed in the not been seen by a PCP or other physician for a prolonged period of time.  Patient currently does not have any insurance.  At that time patient was placed in a well-padded and compressed posterior splint to remove some swelling from the area and a CT scan was ordered.  The patient presented about a week later with the CT scan and for preoperative planning and for consent.  The CT scan showed a comminuted displaced intra-articular calcaneal fracture with increased angle of Gissane decreased Boehler's angle.  Patient also appeared to have a lateral wall fracture with shortening of the calcaneus.  The calcaneus appeared to be in a fairly rectus position overall with no varus though.  Patient was placed back in a compressive dressing with posterior splint and presents today for further management consisting of open reduction with internal fixation.  It was  discussed with patient at his initial visit to stop smoking as smoking will cause his fracture and wound not to heal properly potentially and put him at increased risk for complications.  Patient understands and is agreeable.  All questions answered including postoperative course in detail.  Consent obtained and placed in chart.  DESCRIPTION: After obtaining full informed written consent, the patient was brought back to the operating room and placed lateral upon the operating table.  The patient received IV antibiotics prior to induction.  The patient was placed in the lateral position and a left thigh.  After obtaining adequate anesthesia, the patient was prepped and draped in the standard fashion.  An Esmarch bandage was used to exsanguinate the left lower extremity and the pneumatic thigh tourniquet was inflated.  Attention was then directed to the distal fibula where an incision was made from the distal fibula to the calcaneocuboid joint area over the sinus tarsi.  The incision was deepened to the subcutaneous tissues utilizing sharp and blunt dissection and care was taken to identify and retract all vital neurovascular structures and all venous contributories were cauterized as necessary.  At this time the extensor digitorum brevis muscle belly was identified and reflected dorsal lateral to medial thereby exposing the sinus tarsi and a portion of the subtalar joint at the operative site.  The peroneal tendons were identified and a full-thickness periosteal flap was elevated from the lateral wall the calcaneus keeping the peroneal tendons intact in this lateral flap.  The peroneal tendons were retracted plantarly and posteriorly throughout the remainder the case.  Dissection  was then continued further into the subtalar joint and the calcaneal fibular ligament was released as well as the interosseous talocalcaneal ligament and the subtalar joint was able to be fully visualized.  The subtalar joint appeared  to be depressed plantarly within the body of the calcaneus and the lateral wall the calcaneus also appeared to have a comminuted fracture in place with notable bone void.  A periosteal elevator was then used to free up the posterior facet region that had displaced in a plantarward direction and the posterior facet was then elevated to join the remaining articular surface of the calcaneus and to match the articular surface of the plantar aspect of the talus.  This was held in a reduced position and comparison was obtained with guidewires for 4.0 cannulated screws.  Fluoroscopic guidance was used to visualize correction in the posterior facet appeared to be well aligned at that time.  Utilizing standard AO principles and techniques a 4.0 mm cannulated Biomet Zimmer partially-threaded screw was then placed from lateral to medial with the appropriate size and length.  This was done under fluoroscopic guidance and the fracture fragment appeared to be well stabilized at this time.  Temporary fixation was also still kept in place.  At this time a piece of the lateral wall fracture fragment was resected as it appeared to be floating in free space and not attached anything.  The heel was then examined at the posterior aspect with simulated weightbearing in the heel appeared to be in slight valgus with no varus appreciated.  The calcaneus appeared to be out to length.  The Biomet Zimmer sinus tarsi calcaneal plate was then placed per manufacturer's protocol then temporary fixated in place with temporary fixation K wires.  Utilizing standard AO principles and techniques and fluoroscopic guidance approximately 6 screws were then placed in the sinus tarsi calcaneal fracture plate with the appropriate lengths with 2 screws being in the posterior tuber, one screw being in the posterior facet/calcaneal body and 3 screws being at the anterior process level.  Small stab incisions were made at the posterior tuber and anterior  process screws to be able to place them percutaneously through the jig system set up on the plate.  The surgical site was flushed with copious amounts normal sterile saline.  The bone void that was present laterally at the central body of the calcaneus was then backfilled with Biomet Zimmer bone void filler/liquefied cement.  This was allowed to harden and then examined further in the lateral wall the calcaneus appeared to be solidified and in excellent position overall.  The subtalar joint was then brought through range of motion and noted to have good range of motion present with no clicking or crepitation.  The fixation appeared to be very stable at this time.  The calcaneus was then stressed in multiple positions and subtalar joint appeared to be near anatomic alignment.  The surgical sites were flushed with copious amounts normal sterile saline.  Final C-arm imaging was then taken showing the appropriate placement of screws and sizes.  The appropriate placement of the plate.  The posterior facet appeared to be well aligned overall the calcaneus appeared to be in a fairly rectus position to slight valgus position with the calcaneus brought out to length.  The deep periosteal and capsular structures as well as extensor digitorum brevis muscle belly was then reapproximated well coapted with 3-0 Vicryl.  The peroneal tendon sheath was reapproximated well coapted with 3-0 Vicryl.  The subcutaneous tissue  at all incision sites was reapproximated well coapted with 4-0 Monocryl.  The skin was then reapproximated well coapted with 3-0 nylon in a combination of simple and horizontal mattress type stitching.  The patient was then placed in a postoperative dressing after the pneumatic ankle tourniquet was released and a prompt hyperemic response was noted all digits consisting of Xeroform to the incision lines followed by 4 x 4 gauze, Kerlix, web roll, Ace wrap, posterior splint, Ace wrap.  Patient tolerated the  procedure and anesthesia well was transferred to recovery room vital signs stable vascular status intact all toes the left foot.  Patient was discharged with the appropriate follow-up information and orders.  Medication sent to pharmacy.  Discussed with patient and patient's family prior to case smoking cessation importance of this.  Also discussed DVT prophylaxis and usage of Eliquis 24 hours after surgery as well as knee flexion extension exercises daily.  Pain medication and antibiotics sent as well as antinausea medications.  Patient remain nonweightbearing at all times left lower extremity.  COMPLICATIONS: None  CONDITION: Good, stable  Rosetta Posner, DPM

## 2019-09-04 NOTE — Anesthesia Preprocedure Evaluation (Addendum)
Anesthesia Evaluation  Patient identified by MRN, date of birth, ID band Patient awake    Reviewed: Allergy & Precautions, H&P , NPO status , Patient's Chart, lab work & pertinent test results  History of Anesthesia Complications Negative for: history of anesthetic complications  Airway Mallampati: III  TM Distance: <3 FB Neck ROM: full    Dental  (+) Chipped, Poor Dentition, Missing   Pulmonary neg shortness of breath, Current Smoker and Patient abstained from smoking.,    Pulmonary exam normal        Cardiovascular Exercise Tolerance: Good hypertension, Normal cardiovascular exam     Neuro/Psych negative neurological ROS  negative psych ROS   GI/Hepatic negative GI ROS, Neg liver ROS, neg GERD  ,  Endo/Other  negative endocrine ROS  Renal/GU      Musculoskeletal   Abdominal   Peds  Hematology negative hematology ROS (+)   Anesthesia Other Findings  History reviewed. No pertinent surgical history.  BMI    Body Mass Index: 31.91 kg/m      Reproductive/Obstetrics negative OB ROS                            Anesthesia Physical Anesthesia Plan  ASA: II  Anesthesia Plan: General ETT   Post-op Pain Management: GA combined w/ Regional for post-op pain   Induction: Intravenous  PONV Risk Score and Plan: Ondansetron, Dexamethasone, Midazolam and Treatment may vary due to age or medical condition  Airway Management Planned: Oral ETT and Video Laryngoscope Planned  Additional Equipment:   Intra-op Plan:   Post-operative Plan: Extubation in OR  Informed Consent: I have reviewed the patients History and Physical, chart, labs and discussed the procedure including the risks, benefits and alternatives for the proposed anesthesia with the patient or authorized representative who has indicated his/her understanding and acceptance.     Dental Advisory Given  Plan Discussed with:  Anesthesiologist, CRNA and Surgeon  Anesthesia Plan Comments: (Patient consented for risks of anesthesia including but not limited to:  - adverse reactions to medications - damage to eyes, teeth, lips or other oral mucosa - nerve damage due to positioning  - sore throat or hoarseness - Damage to heart, brain, nerves, lungs, other parts of body or loss of life  Patient voiced understanding.)       Anesthesia Quick Evaluation

## 2019-09-04 NOTE — Anesthesia Procedure Notes (Signed)
Anesthesia Regional Block: Popliteal block   Pre-Anesthetic Checklist: ,, timeout performed, Correct Patient, Correct Site, Correct Laterality, Correct Procedure, Correct Position, site marked, Risks and benefits discussed,  Surgical consent,  Pre-op evaluation,  At surgeon's request and post-op pain management  Laterality: Lower and Left  Prep: chloraprep       Needles:  Injection technique: Single-shot  Needle Type: Echogenic Needle     Needle Length: 9cm  Needle Gauge: 21     Additional Needles:   Procedures:,,,, ultrasound used (permanent image in chart),,,,  Narrative:  Start time: 09/04/2019 10:22 AM End time: 09/04/2019 10:34 AM Injection made incrementally with aspirations every 5 mL.  Performed by: Personally  Anesthesiologist: Celia Gibbons, Cleda Mccreedy, MD  Additional Notes: Patient consented for risk and benefits of nerve block including but not limited to nerve damage, failed block, bleeding and infection.  Patient voiced understanding.  Adductor canal block placed on left lower extremity as well.  Functioning IV was confirmed and monitors were applied.  Timeout done prior to procedure and prior to any sedation being given to the patient.  Patient confirmed procedure site prior to any sedation given to the patient.  A 22mm 22ga Stimuplex needle was used. Sterile prep,hand hygiene and sterile gloves were used.  Minimal sedation used for procedure.  No paresthesia endorsed by patient during the procedure.  Negative aspiration and negative test dose prior to incremental administration of local anesthetic. The patient tolerated the procedure well with no immediate complications.

## 2019-09-04 NOTE — Transfer of Care (Signed)
Immediate Anesthesia Transfer of Care Note  Patient: Jason Hines  Procedure(s) Performed: OPEN REDUCTION INTERNAL FIXATION (ORIF) CALCANEOUS FRACTURE LEFT (Left Foot)  Patient Location: PACU  Anesthesia Type:General  Level of Consciousness: sedated  Airway & Oxygen Therapy: Patient Spontanous Breathing and Patient connected to nasal cannula oxygen  Post-op Assessment: Report given to RN and Post -op Vital signs reviewed and stable  Post vital signs: Reviewed and stable  Last Vitals:  Vitals Value Taken Time  BP 143/88 09/04/19 1340  Temp    Pulse 81 09/04/19 1340  Resp 19 09/04/19 1340  SpO2 95 % 09/04/19 1340  Vitals shown include unvalidated device data.  Last Pain:  Vitals:   09/04/19 0947  TempSrc: Temporal  PainSc: 4          Complications: No complications documented.

## 2019-09-04 NOTE — H&P (Addendum)
HISTORY AND PHYSICAL INTERVAL NOTE:  09/04/2019   Jason Hines  has presented today for surgery, with the diagnosis of S92.012A  CLOSED DISPLACED FRACTURE LEFT CALCANEUS.  The various methods of treatment have been discussed with the patient.  No guarantees were given.  After consideration of risks, benefits and other options for treatment, the patient has consented to surgery.  I have reviewed the patients' chart and labs.    PROCEDURE: LEFT CALCANEAL FRACTURE ORIF  A history and physical examination was performed in my office.  The patient was reexamined.  There have been no changes to this history and physical examination.  Rosetta Posner, DPM

## 2019-09-04 NOTE — Anesthesia Procedure Notes (Signed)
Procedure Name: Intubation Date/Time: 09/04/2019 10:47 AM Performed by: Armanda Heritage, CRNA Pre-anesthesia Checklist: Patient identified, Emergency Drugs available, Suction available, Patient being monitored and Timeout performed Patient Re-evaluated:Patient Re-evaluated prior to induction Oxygen Delivery Method: Circle system utilized Preoxygenation: Pre-oxygenation with 100% oxygen Induction Type: IV induction Ventilation: Mask ventilation without difficulty Laryngoscope Size: McGraph and 4 Grade View: Grade I Tube size: 7.5 mm Number of attempts: 1 Airway Equipment and Method: Stylet Placement Confirmation: ETT inserted through vocal cords under direct vision,  breath sounds checked- equal and bilateral and positive ETCO2 Secured at: 23 cm Dental Injury: Teeth and Oropharynx as per pre-operative assessment

## 2019-09-04 NOTE — Discharge Instructions (Addendum)
[facility]  POST OPERATIVE INSTRUCTIONS FOR DR. TROXLER, DR. Ether Griffins, AND [facility] P[facility]   1. Take your medication as prescribed.  Pain medication should be taken only as needed.  May take ibuprofen or tylenol between doses.  2. Keep the dressing clean, dry and intact.  Remain nonweightbearing at all times to the left foot.  3. Keep your foot elevated above the heart level for the first 48 hours.  Take eliquis medication 24 hours after surgery.  Take antibiotics as prescribed until gone.  Discontinue smoking as smoking will cause delayed incision and bone healing.  4. Walking to the bathroom and brief periods of walking are acceptable, unless we have instructed you to be non-weight bearing.  5. Always wear your post-op shoe when walking.  Always use your crutches if you are to be non-weight bearing.  6. Do not take a shower. Baths are permissible as long as the foot is kept out of the water.   7. Every hour you are awake:  - Bend your knee 15 times. - Massage calf 15 times  8. Call [facility] [phone]) if any of the following problems occur: - You develop a temperature or fever. - The bandage becomes saturated with blood. - Medication does not stop your pain. - Injury of the foot occurs. - Any symptoms of infection including redness, odor, or red streaks running from wound.   AMBULATORY SURGERY  DISCHARGE INSTRUCTIONS   1) The drugs that you were given will stay in your system until tomorrow so for the next 24 hours you should not:  A) Drive an automobile B) Make any legal decisions C) Drink any alcoholic beverage   2) You may resume regular meals tomorrow.  Today it is better to start with liquids and gradually work up to solid foods.  You may eat anything you prefer, but it is better to start with liquids, then soup and crackers, and gradually work up to solid foods.   3) Please notify  your doctor immediately if you have any unusual bleeding, trouble breathing, redness and pain at the surgery site, drainage, fever, or pain not relieved by medication.    4) Additional Instructions:        Please contact your physician with any problems or Same Day Surgery at [phone], Monday through Friday 6 am to 4 pm, or [place] at [facility] number at [phone].

## 2019-09-07 ENCOUNTER — Encounter: Payer: Self-pay | Admitting: Podiatry

## 2021-07-05 IMAGING — RF DG OS CALCIS 2+V*L*
1 series · 7 of 7 positions shown · non-contrast
Comparison: 08/25/2019

CLINICAL DATA: Left calcaneal ORIF

EXAM:
LEFT OS CALCIS - 2+ VIEW; DG C-ARM 1-60 MIN

[Series 1: dg x-ray · 0.14mm/px · 7 of 7 slices shown]
[im 1/7]
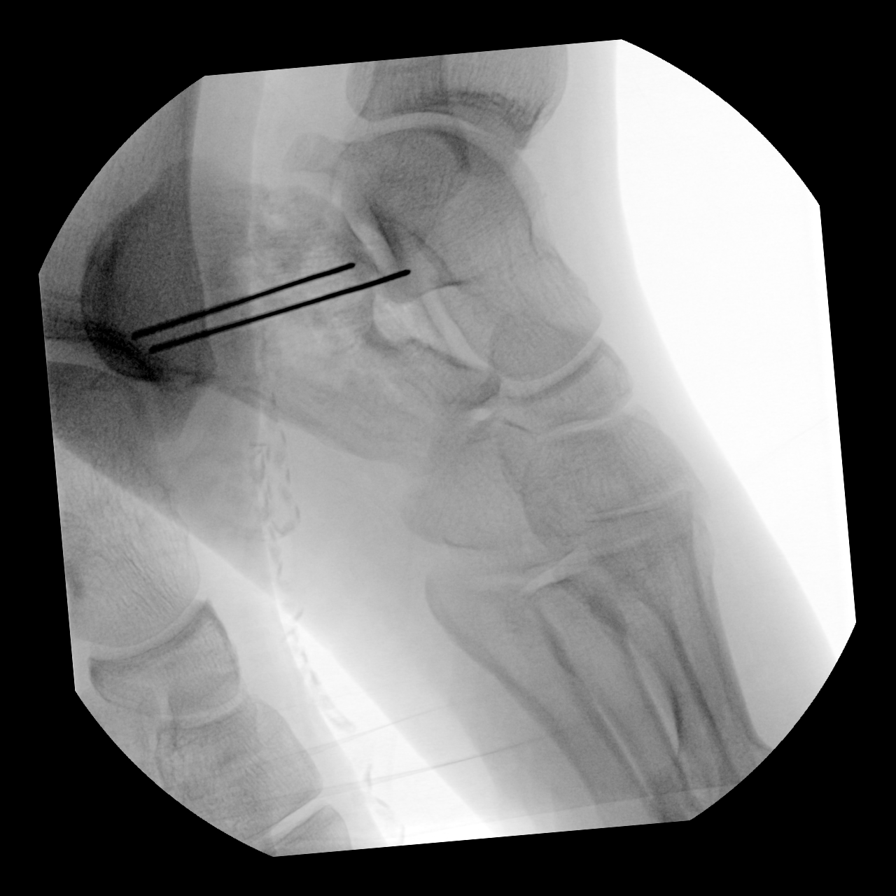
[im 2/7]
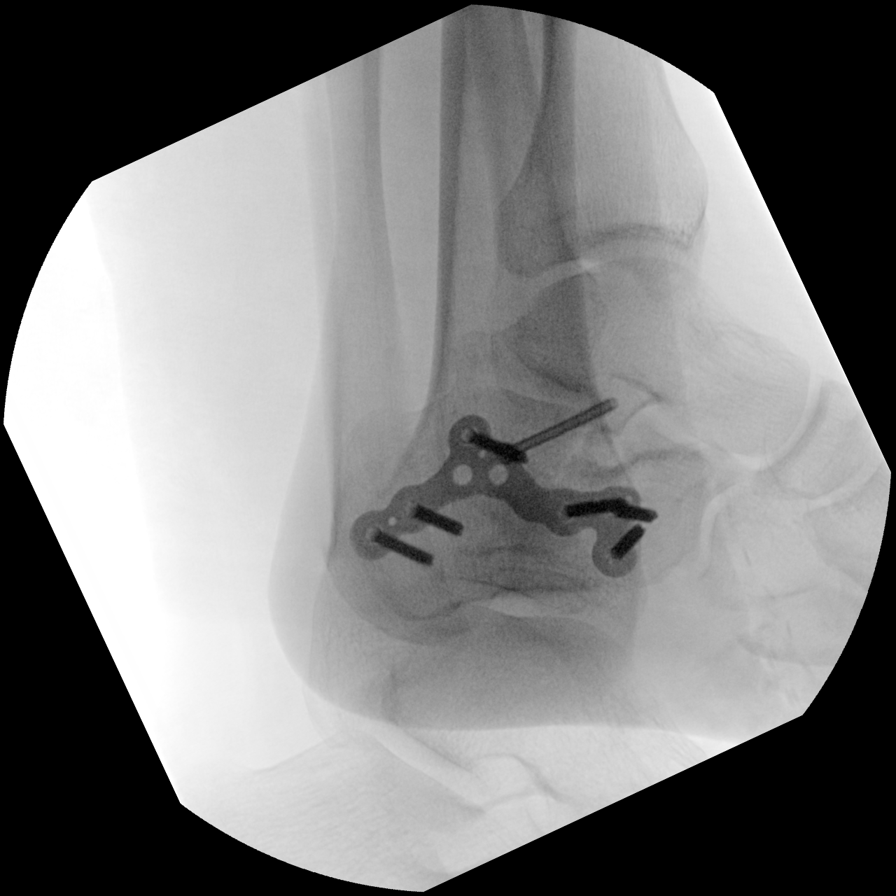
[im 3/7]
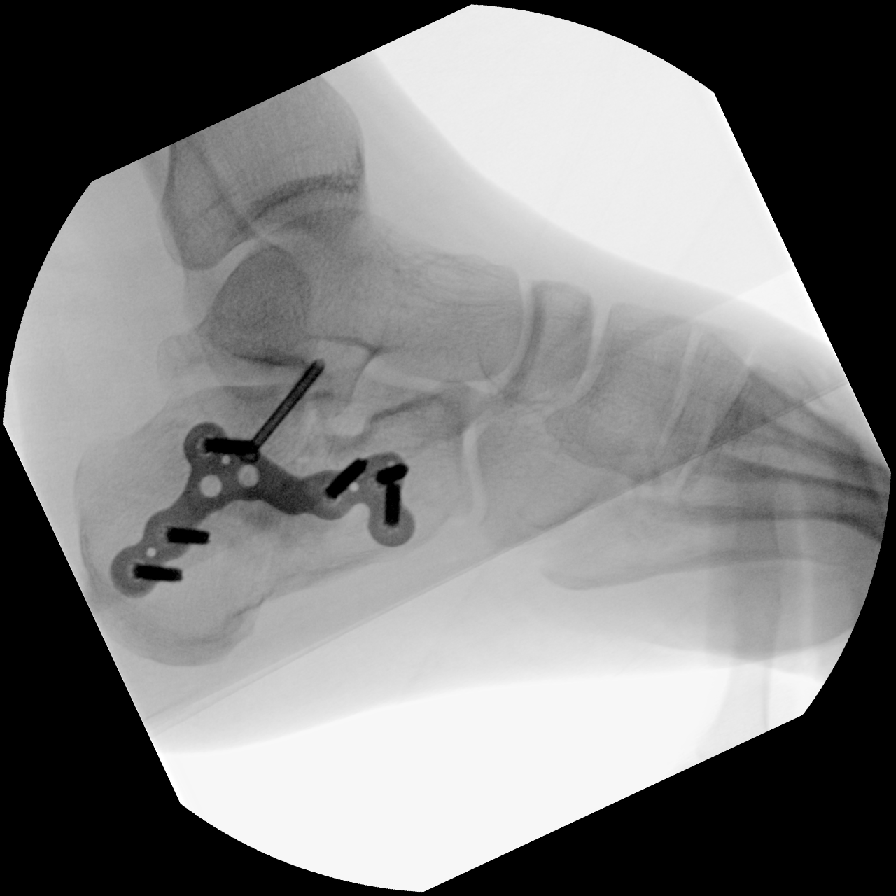
[im 4/7]
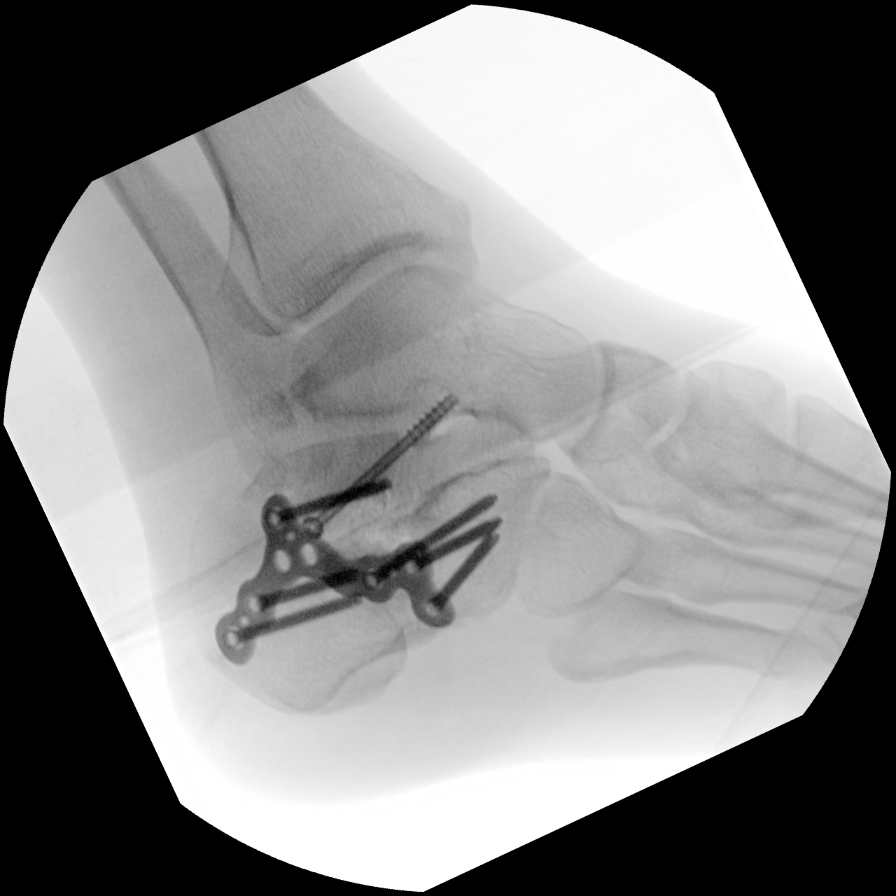
[im 5/7]
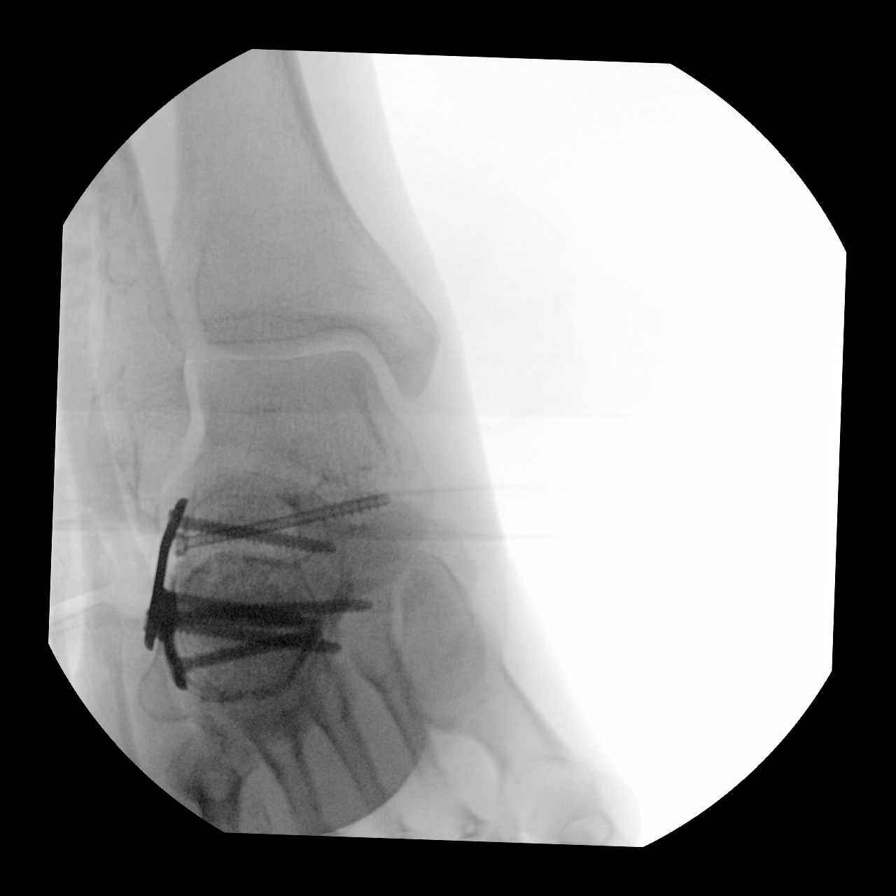
[im 6/7]
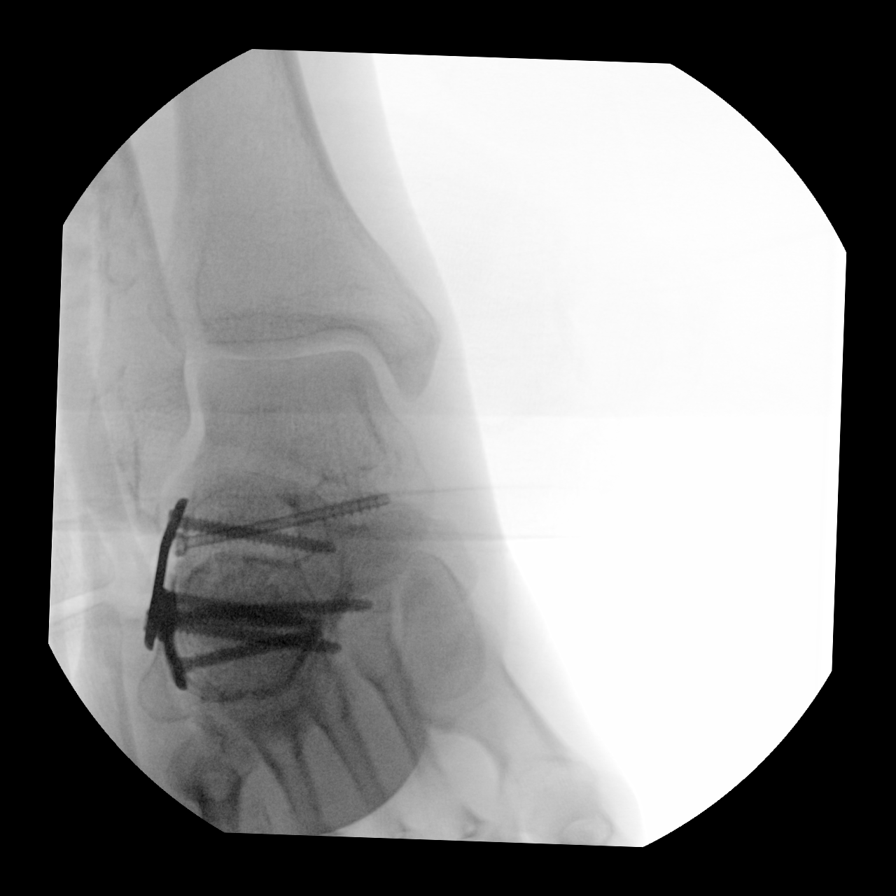
[im 7/7]
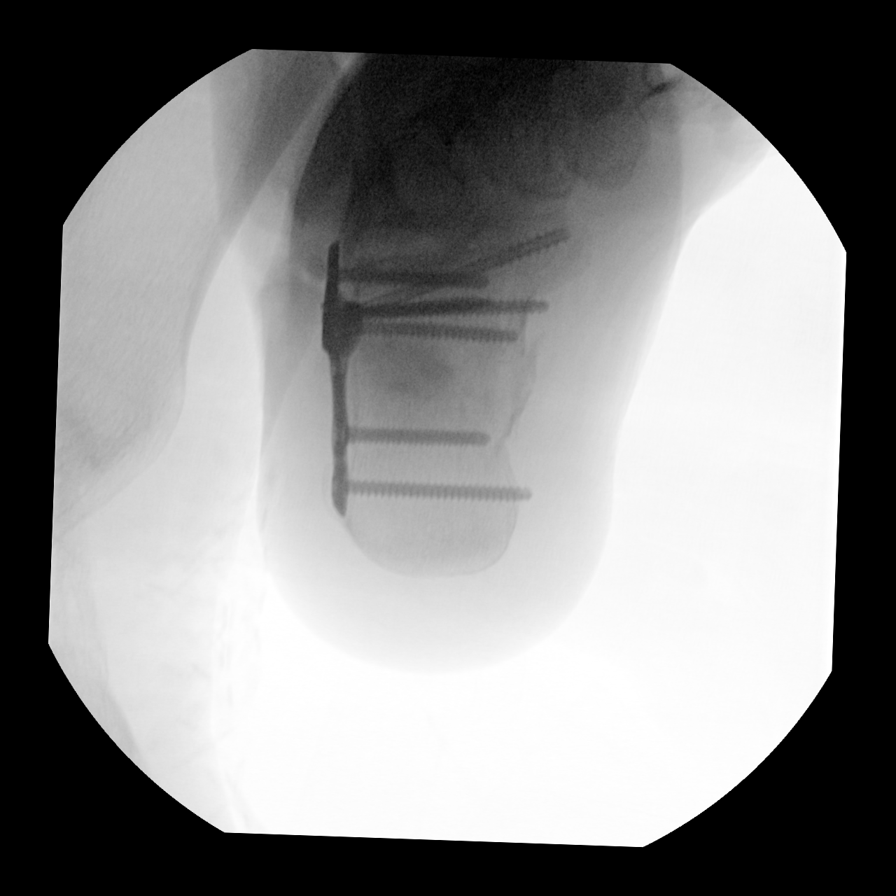

[7 of 7 positions shown; findings below may reference images not displayed]

FINDINGS: 6 C-arm fluoroscopic images were obtained intraoperatively and
submitted for post operative interpretation. Placement of ORIF
hardware to the left calcaneus including a lateral sideplate and
screw fixation construct. Osseous alignment appears improved from
prior. 44 seconds of fluoroscopy time was utilized. Please see the
performing provider's procedural report for further detail.
IMPRESSION: As above.

## 2021-12-07 ENCOUNTER — Emergency Department: Payer: Self-pay

## 2021-12-07 ENCOUNTER — Emergency Department
Admission: EM | Admit: 2021-12-07 | Discharge: 2021-12-07 | Disposition: A | Discharge status: Home / Self Care | Payer: Self-pay | Attending: Emergency Medicine | Admitting: Emergency Medicine

## 2021-12-07 ENCOUNTER — Other Ambulatory Visit: Payer: Self-pay

## 2021-12-07 DIAGNOSIS — I10 Essential (primary) hypertension: Secondary | ICD-10-CM | POA: Insufficient documentation

## 2021-12-07 DIAGNOSIS — N200 Calculus of kidney: Secondary | ICD-10-CM

## 2021-12-07 DIAGNOSIS — R109 Unspecified abdominal pain: Secondary | ICD-10-CM

## 2021-12-07 DIAGNOSIS — N132 Hydronephrosis with renal and ureteral calculous obstruction: Secondary | ICD-10-CM | POA: Insufficient documentation

## 2021-12-07 HISTORY — DX: Essential (primary) hypertension: I10

## 2021-12-07 LAB — CBC
HCT: 46.3 % (ref 39.0–52.0)
Hemoglobin: 15.8 g/dL (ref 13.0–17.0)
MCH: 29.7 pg (ref 26.0–34.0)
MCHC: 34.1 g/dL (ref 30.0–36.0)
MCV: 87 fL (ref 80.0–100.0)
Platelets: 215 10*3/uL (ref 150–400)
RBC: 5.32 MIL/uL (ref 4.22–5.81)
RDW: 12.7 % (ref 11.5–15.5)
WBC: 8.6 10*3/uL (ref 4.0–10.5)
nRBC: 0 % (ref 0.0–0.2)

## 2021-12-07 LAB — BASIC METABOLIC PANEL
Anion gap: 8 (ref 5–15)
BUN: 13 mg/dL (ref 6–20)
CO2: 22 mmol/L (ref 22–32)
Calcium: 9.2 mg/dL (ref 8.9–10.3)
Chloride: 110 mmol/L (ref 98–111)
Creatinine, Ser: 1.19 mg/dL (ref 0.61–1.24)
GFR, Estimated: >60 mL/min (ref >60)
Glucose, Bld: 113 mg/dL — ABNORMAL HIGH (ref 70–99)
Potassium: 4.1 mmol/L (ref 3.5–5.1)
Sodium: 140 mmol/L (ref 135–145)

## 2021-12-07 LAB — URINALYSIS, ROUTINE W REFLEX MICROSCOPIC
Bilirubin Urine: NEGATIVE
Glucose, UA: NEGATIVE mg/dL
Ketones, ur: NEGATIVE mg/dL
Leukocytes,Ua: NEGATIVE
Nitrite: NEGATIVE
Protein, ur: 30 mg/dL — AB
RBC / HPF: >50 RBC/hpf — ABNORMAL HIGH (ref 0–5)
Specific Gravity, Urine: 1.024 (ref 1.005–1.030)
pH: 5 (ref 5.0–8.0)

## 2021-12-07 MED ORDER — HYDROCODONE-ACETAMINOPHEN 5-325 MG PO TABS: 1.0000 | ORAL_TABLET | ORAL | 0 refills | Status: AC | PRN

## 2021-12-07 MED ORDER — MORPHINE SULFATE (PF) 4 MG/ML IV SOLN
4.0000 mg | Freq: Once | INTRAVENOUS | Status: AC
  Administered 2021-12-07: 4 mg via INTRAVENOUS
  Filled 2021-12-07: qty 1

## 2021-12-07 MED ORDER — KETOROLAC TROMETHAMINE 60 MG/2ML IM SOLN
30.0000 mg | Freq: Once | INTRAMUSCULAR | Status: AC
  Administered 2021-12-07: 30 mg via INTRAMUSCULAR
  Filled 2021-12-07: qty 2

## 2021-12-07 MED ORDER — SODIUM CHLORIDE 0.9 % IV BOLUS
1000.0000 mL | Freq: Once | INTRAVENOUS | Status: AC
  Administered 2021-12-07: 1000 mL via INTRAVENOUS

## 2021-12-07 MED ORDER — TAMSULOSIN HCL 0.4 MG PO CAPS: 0.4000 mg | ORAL_CAPSULE | Freq: Every day | ORAL | 0 refills | Status: AC

## 2021-12-07 MED ORDER — ONDANSETRON 4 MG PO TBDP: 4.0000 mg | ORAL_TABLET | Freq: Three times a day (TID) | ORAL | 0 refills | Status: AC | PRN

## 2021-12-07 NOTE — ED Notes (Signed)
Patient ambulated to hallway bathroom with a steady gait. Patient c/o left flank pain with movement.

## 2021-12-07 NOTE — ED Notes (Signed)
Patient gave urine specimen that is amber and clear.

## 2021-12-07 NOTE — ED Provider Notes (Addendum)
[facility] Provider Note    Event Date/Time   First MD Initiated Contact with Patient 12/07/21 1136     (approximate)   History   Flank Pain   HPI  Jason Hines is a 36 y.o. male with no significant past medical history who presents to the emergency department with left flank pain.  Patient endorses a 3-day history of ongoing and constant left flank pain.  Sharp stabbing pain that radiates to his groin.  Associated with some difficulty starting urination.  Denies any nausea or vomiting.  Denies any fever.  Does endorse 1 history of kidney stones in the past but was not evaluated by urology.  Did not need any lithotripsy or stenting.  Took 1 ibuprofen prior to arrival.  Drinks a large amount of soda and very minimal water.     Physical Exam   Triage Vital Signs: ED Triage Vitals [12/07/21 1121]  Enc Vitals Group     BP (!) 183/123     Pulse Rate 80     Resp 16     Temp 98.6 F (37 C)     Temp src      SpO2 96 %     Weight 220 lb (99.8 kg)     Height 5\' 8"  (1.727 m)     Head Circumference      Peak Flow      Pain Score 9     Pain Loc      Pain Edu?      Excl. in [place]?     Most recent vital signs: Vitals:   12/07/21 1121  BP: (!) 183/123  Pulse: 80  Resp: 16  Temp: 98.6 F (37 C)  SpO2: 96%    Physical Exam Constitutional:      General: He is in acute distress.     Appearance: He is well-developed.     Comments: Appears uncomfortable, moving all over the stretcher.  Holding his left flank  HENT:     Head: Atraumatic.  Eyes:     Conjunctiva/sclera: Conjunctivae normal.  Cardiovascular:     Rate and Rhythm: Regular rhythm.  Pulmonary:     Effort: No respiratory distress.  Abdominal:     Tenderness: There is left CVA tenderness.  Musculoskeletal:     Cervical back: Normal range of motion.  Skin:    General: Skin is warm.  Neurological:     Mental Status: He is alert. Mental status is at baseline.          IMPRESSION /  MDM / ASSESSMENT AND PLAN / ED COURSE  I reviewed the triage vital signs and the nursing notes.  Differential diagnosis including kidney stone, musculoskeletal, gastritis, pyelonephritis.  Low suspicion for testicular torsion or epididymitis.  Patient was given IM ketorolac will obtain lab work and CT scan without contrast given low suspicion for intra-abdominal infectious process.  RADIOLOGY I independently reviewed imaging, my interpretation of imaging: CT scan without contrast showed mild hydronephrosis with an obstructing kidney stone.  CT scan was read as 3 mm obstructing renal stone to the left UVJ with mild left-sided hydronephrosis and stranding.    ED Results / Procedures / Treatments   Labs (all labs ordered are listed, but only abnormal results are displayed) Labs interpreted as -   UA with blood but no signs of urinary tract infection.  Creatinine is at baseline.  Labs Reviewed  BASIC METABOLIC PANEL - Abnormal; Notable for the following components:  Result Value   Glucose, Bld 113 (*)    All other components within normal limits  URINALYSIS, ROUTINE W REFLEX MICROSCOPIC - Abnormal; Notable for the following components:   Color, Urine YELLOW (*)    APPearance CLEAR (*)    Hgb urine dipstick LARGE (*)    Protein, ur 30 (*)    RBC / HPF >50 (*)    Bacteria, UA RARE (*)    All other components within normal limits  CBC   2:27 PM On reevaluation patient continues to have significant pain following IM ketorolac.  We will start an IV and give IV morphine.  States that he could find a ride.  We will give a bolus of fluids.  On reevaluation pain has improved.  Will discharge the patient with instructions to follow-up with urology.  Discussed scheduling NSAIDs, drink water and no soda and given a short course of narcotic pain medication.  Discussed risk and benefits of this medication.  Given return precautions for worsening symptoms or signs of  infection.   PROCEDURES:  Critical Care performed: No  Procedures  Patient's presentation is most consistent with acute presentation with potential threat to life or bodily function.   MEDICATIONS ORDERED IN ED: Medications  ketorolac (TORADOL) injection 30 mg (30 mg Intramuscular Given 12/07/21 1216)  morphine (PF) 4 MG/ML injection 4 mg (4 mg Intravenous Given 12/07/21 1353)  sodium chloride 0.9 % bolus 1,000 mL (1,000 mLs Intravenous New Bag/Given 12/07/21 1352)    FINAL CLINICAL IMPRESSION(S) / ED DIAGNOSES   Final diagnoses:  Flank pain  Kidney stone on left side  Hypertension, unspecified type     Rx / DC Orders   ED Discharge Orders          Ordered    HYDROcodone-acetaminophen (NORCO/VICODIN) 5-325 MG tablet  Every 4 hours PRN        12/07/21 1309    tamsulosin (FLOMAX) 0.4 MG CAPS capsule  Daily after breakfast        12/07/21 1309    ondansetron (ZOFRAN-ODT) 4 MG disintegrating tablet  Every 8 hours PRN        12/07/21 1309             Note:  This document was prepared using Dragon voice recognition software and may include unintentional dictation errors.   Corena Herter, MD 12/07/21 1310    Corena Herter, MD 12/07/21 1427

## 2021-12-07 NOTE — Discharge Instructions (Addendum)
You are seen in the emergency department and diagnosed with a 3 mm kidney stone on the left side.  This kidney stone is small enough that it should pass on its own.  It is importantly stop drinking soda or sugary drinks and only drink water.  You were started on a medication that will make you pee more often and it is important that you stay hydrated while on this medication.  Follow-up with urology.  Your blood pressure was elevated in the emergency department.  This could be due to your pain from your kidney stone however you need to follow-up with your primary care provider so they can follow you up for your high blood pressure.  Pain control:  Ibuprofen (motrin/aleve/advil) - You can take 3-4 tablets (600-800 mg) every 6 hours as needed for pain/fever.  Acetaminophen (tylenol) - You can take 2 extra strength tablets (1000 mg) every 6 hours as needed for pain/fever.  You can alternate these medications or take them together.  Make sure you eat food/drink water when taking these medications.  You were given a prescription for narcotic pain medications.  Take only if in severe pain.  These are very addictive medications.  These medications can make you constipated.  If you need to take more than 1-2 doses, start a stool softner.  If you become constipated, take 1 capfull of MiraLAX, can repeat untill having regular bowel movements.  Keep this medication out of reach of any children.

## 2021-12-07 NOTE — ED Triage Notes (Signed)
Pt to ED for left sided flank pain for past few days, worsening today. Denies n/v. Denies difficulty urinating or hematuria.

## 2021-12-22 ENCOUNTER — Ambulatory Visit: Payer: Self-pay | Admitting: Urology

## 2021-12-22 ENCOUNTER — Encounter: Payer: Self-pay | Admitting: Urology

## 2022-09-28 ENCOUNTER — Emergency Department
Admission: EM | Admit: 2022-09-28 | Discharge: 2022-09-28 | Disposition: A | Discharge status: Home / Self Care | Payer: Self-pay | Attending: Emergency Medicine | Admitting: Emergency Medicine

## 2022-09-28 ENCOUNTER — Other Ambulatory Visit: Payer: Self-pay

## 2022-09-28 ENCOUNTER — Emergency Department: Payer: Self-pay

## 2022-09-28 DIAGNOSIS — S62639B Displaced fracture of distal phalanx of unspecified finger, initial encounter for open fracture: Secondary | ICD-10-CM

## 2022-09-28 DIAGNOSIS — Z7901 Long term (current) use of anticoagulants: Secondary | ICD-10-CM | POA: Insufficient documentation

## 2022-09-28 DIAGNOSIS — S62631B Displaced fracture of distal phalanx of left index finger, initial encounter for open fracture: Secondary | ICD-10-CM | POA: Insufficient documentation

## 2022-09-28 DIAGNOSIS — Z7982 Long term (current) use of aspirin: Secondary | ICD-10-CM | POA: Insufficient documentation

## 2022-09-28 DIAGNOSIS — W312XXA Contact with powered woodworking and forming machines, initial encounter: Secondary | ICD-10-CM | POA: Insufficient documentation

## 2022-09-28 MED ORDER — OXYCODONE-ACETAMINOPHEN 5-325 MG PO TABS
1.0000 | ORAL_TABLET | Freq: Once | ORAL | Status: AC
  Administered 2022-09-28: 1 via ORAL
  Filled 2022-09-28: qty 1

## 2022-09-28 MED ORDER — CEPHALEXIN 500 MG PO CAPS: 500.0000 mg | ORAL_CAPSULE | Freq: Four times a day (QID) | ORAL | 0 refills | Status: AC

## 2022-09-28 MED ORDER — LIDOCAINE HCL (PF) 1 % IJ SOLN
5.0000 mL | Freq: Once | INTRAMUSCULAR | Status: AC
  Administered 2022-09-28: 5 mL
  Filled 2022-09-28: qty 5

## 2022-09-28 MED ORDER — OXYCODONE HCL 5 MG PO TABS: 5.0000 mg | ORAL_TABLET | Freq: Three times a day (TID) | ORAL | 0 refills | Status: AC | PRN

## 2022-09-28 MED ORDER — CEFAZOLIN SODIUM-DEXTROSE 2-4 GM/100ML-% IV SOLN
2.0000 g | INTRAVENOUS | Status: AC
  Administered 2022-09-28: 2 g via INTRAVENOUS
  Filled 2022-09-28: qty 100

## 2022-09-28 NOTE — Discharge Instructions (Signed)
Please keep dressing clean and dry.  You may change dressing as needed.  Keep splint intact with the digit in full extension at all times.  Take antibiotics as prescribed return to the ER for any increasing pain swelling warmth redness drainage or any urgent changes in your health

## 2022-09-28 NOTE — ED Notes (Addendum)
NA

## 2022-09-28 NOTE — ED Provider Notes (Signed)
[place] [facility] AT [facility] Provider Note   CSN: [id number] Arrival date & time: 09/28/22  1637     History  Chief Complaint  Patient presents with   Laceration    Arleigh Greenia is a 37 y.o. male.  Presents to the emergency department valuation of laceration to the left index finger.  Injury occurred just prior to arrival with chop saw.  Tetanus is up-to-date.  Pain is moderate.  Sensation is intact distally.  Able to actively extend the digit.  HPI     Home Medications Prior to Admission medications   Medication Sig Start Date End Date Taking? Authorizing Provider  cephALEXin (KEFLEX) 500 MG capsule Take 1 capsule (500 mg total) by mouth 4 (four) times daily for 10 days. 09/28/22 10/08/22 Yes Evon Slack, PA-C  oxyCODONE (ROXICODONE) 5 MG immediate release tablet Take 1 tablet (5 mg total) by mouth every 8 (eight) hours as needed. 09/28/22 09/28/23 Yes Evon Slack, PA-C  apixaban (ELIQUIS) 2.5 MG TABS tablet Take 1 tablet (2.5 mg total) by mouth 2 (two) times daily. 09/05/19   Rosetta Posner, DPM  aspirin EC 81 MG tablet Take 1 tablet (81 mg total) by mouth daily. Swallow whole. 09/05/19   Rosetta Posner, DPM  HYDROcodone-acetaminophen (NORCO/VICODIN) 5-325 MG tablet Take 1 tablet by mouth every 4 (four) hours as needed for moderate pain. 12/07/21 12/07/22  Corena Herter, MD  ibuprofen (ADVIL) 600 MG tablet Take 1 tablet (600 mg total) by mouth every 8 (eight) hours as needed. Patient taking differently: Take 600 mg by mouth every 8 (eight) hours as needed (pain.).  08/21/19   Joni Reining, PA-C  ondansetron (ZOFRAN-ODT) 4 MG disintegrating tablet Take 1 tablet (4 mg total) by mouth every 8 (eight) hours as needed for nausea or vomiting. 12/07/21   Corena Herter, MD      Allergies    Patient has no known allergies.    Review of Systems   Review of Systems  Physical Exam Updated Vital Signs BP (!) 146/107   Pulse 85   Temp 98 F (36.7 C)   Resp  18   SpO2 100%  Physical Exam Constitutional:      Appearance: He is well-developed.  HENT:     Head: Normocephalic and atraumatic.  Eyes:     Conjunctiva/sclera: Conjunctivae normal.  Cardiovascular:     Rate and Rhythm: Normal rate.  Pulmonary:     Effort: Pulmonary effort is normal. No respiratory distress.  Musculoskeletal:        General: Normal range of motion.     Cervical back: Normal range of motion.     Comments: Left index finger with laceration across the distal phalanx near the DIP region.  Nail is intact.  Sensation is intact distally.  Subtle droop in the distal phalanx.  Bleeding well-controlled.  Skin:    General: Skin is warm.     Findings: No rash.  Neurological:     General: No focal deficit present.     Mental Status: He is alert and oriented to person, place, and time.  Psychiatric:        Behavior: Behavior normal.        Thought Content: Thought content normal.     ED Results / Procedures / Treatments   Labs (all labs ordered are listed, but only abnormal results are displayed) Labs Reviewed - No data to display  EKG None  Radiology DG Finger Index Left  Result Date: 09/28/2022  CLINICAL DATA:  Status post trauma. EXAM: LEFT INDEX FINGER 2+V COMPARISON:  None Available. FINDINGS: There is an acute, comminuted, mildly displaced fracture deformity involving the distal phalanx of the second left finger. There is no evidence of dislocation. There is no evidence of arthropathy or other focal bone abnormality. Diffuse soft tissue swelling is seen throughout the second left finger. IMPRESSION: Acute fracture of the distal phalanx of the second left finger. Electronically Signed   By: Aram Candela M.D.   On: 09/28/2022 18:56    Procedures .Marland KitchenLaceration Repair  Date/Time: 09/28/2022 7:26 PM  Performed by: Evon Slack, PA-C Authorized by: Evon Slack, PA-C   Consent:    Consent obtained:  Verbal   Consent given by:  Patient Anesthesia:     Anesthesia method:  Nerve block   Block needle gauge:  27 G   Block anesthetic:  Lidocaine 1% w/o epi   Block technique:  Left index digital block   Block outcome:  Anesthesia achieved Laceration details:    Location:  Finger   Finger location:  L index finger   Length (cm):  3 Pre-procedure details:    Preparation:  Patient was prepped and draped in usual sterile fashion Exploration:    Wound exploration: wound explored through full range of motion     Contaminated: no   Treatment:    Area cleansed with:  Povidone-iodine and saline   Amount of cleaning:  Standard   Irrigation method:  Pressure wash Repair type:    Repair type:  Simple Post-procedure details:    Dressing:  Bulky dressing Comments:     Aluminum foam splint     Medications Ordered in ED Medications  ceFAZolin (ANCEF) IVPB 2g/100 mL premix (2 g Intravenous New Bag/Given 09/28/22 1908)  oxyCODONE-acetaminophen (PERCOCET/ROXICET) 5-325 MG per tablet 1 tablet (1 tablet Oral Given 09/28/22 1743)  lidocaine (PF) (XYLOCAINE) 1 % injection 5 mL (5 mLs Infiltration Given 09/28/22 1745)    ED Course/ Medical Decision Making/ A&P                                 Medical Decision Making Amount and/or Complexity of Data Reviewed Radiology: ordered.  Risk Prescription drug management.   Acute distal phalanx fracture left index finger, open fracture.  Wound thoroughly cleansed irrigated and soaked in Betadine and saline after digital block.  Patient underwent laceration repair with 4-0 nylon sutures.  Patient was given IV antibiotics and discharged home with p.o. antibiotics and will follow-up with orthopedics. Final Clinical Impression(s) / ED Diagnoses Final diagnoses:  Open avulsion fracture of distal phalanx of finger, initial encounter    Rx / DC Orders ED Discharge Orders          Ordered    oxyCODONE (ROXICODONE) 5 MG immediate release tablet  Every 8 hours PRN        09/28/22 1923    cephALEXin (KEFLEX) 500  MG capsule  4 times daily        09/28/22 1923              Ronnette Juniper 09/28/22 1927    Merwyn Katos, MD 09/28/22 1946

## 2022-09-28 NOTE — ED Triage Notes (Signed)
Pt comes with c/o laceration to right pointer finger. Pt state he cut it with chop saw. Bleeding controlled.

## 2022-10-01 NOTE — Group Note (Deleted)
